# Patient Record
Sex: Male | Born: 1965 | Race: White | Hispanic: No | Marital: Single | Smoking: Former smoker
Health system: Southern US, Community
[De-identification: ages and names within clinical notes are randomized; demographics above are authoritative.]

## PROBLEM LIST (undated history)

## (undated) DIAGNOSIS — F319 Bipolar disorder, unspecified: Secondary | ICD-10-CM

## (undated) DIAGNOSIS — K649 Unspecified hemorrhoids: Secondary | ICD-10-CM

## (undated) DIAGNOSIS — K219 Gastro-esophageal reflux disease without esophagitis: Secondary | ICD-10-CM

## (undated) DIAGNOSIS — F514 Sleep terrors [night terrors]: Secondary | ICD-10-CM

## (undated) DIAGNOSIS — I10 Essential (primary) hypertension: Secondary | ICD-10-CM

## (undated) HISTORY — PX: KNEE SURGERY: SHX244

---

## 2012-05-15 ENCOUNTER — Emergency Department (HOSPITAL_COMMUNITY): Payer: Non-veteran care

## 2012-05-15 ENCOUNTER — Inpatient Hospital Stay (HOSPITAL_COMMUNITY)
Admission: EM | Admit: 2012-05-15 | Discharge: 2012-05-17 | DRG: 313 | Disposition: A | Discharge status: Home / Self Care | Payer: Non-veteran care | Attending: Internal Medicine | Admitting: Internal Medicine

## 2012-05-15 DIAGNOSIS — F319 Bipolar disorder, unspecified: Secondary | ICD-10-CM | POA: Diagnosis present

## 2012-05-15 DIAGNOSIS — Z833 Family history of diabetes mellitus: Secondary | ICD-10-CM

## 2012-05-15 DIAGNOSIS — E669 Obesity, unspecified: Secondary | ICD-10-CM | POA: Diagnosis present

## 2012-05-15 DIAGNOSIS — R0789 Other chest pain: Principal | ICD-10-CM | POA: Diagnosis present

## 2012-05-15 DIAGNOSIS — Z8249 Family history of ischemic heart disease and other diseases of the circulatory system: Secondary | ICD-10-CM

## 2012-05-15 DIAGNOSIS — K219 Gastro-esophageal reflux disease without esophagitis: Secondary | ICD-10-CM

## 2012-05-15 DIAGNOSIS — I951 Orthostatic hypotension: Secondary | ICD-10-CM

## 2012-05-15 DIAGNOSIS — I10 Essential (primary) hypertension: Secondary | ICD-10-CM

## 2012-05-15 DIAGNOSIS — R079 Chest pain, unspecified: Secondary | ICD-10-CM

## 2012-05-15 DIAGNOSIS — N179 Acute kidney failure, unspecified: Secondary | ICD-10-CM

## 2012-05-15 DIAGNOSIS — Z6841 Body Mass Index (BMI) 40.0 and over, adult: Secondary | ICD-10-CM

## 2012-05-15 HISTORY — DX: Essential (primary) hypertension: I10

## 2012-05-15 HISTORY — DX: Bipolar disorder, unspecified: F31.9

## 2012-05-15 HISTORY — DX: Sleep terrors (night terrors): F51.4

## 2012-05-15 HISTORY — DX: Gastro-esophageal reflux disease without esophagitis: K21.9

## 2012-05-15 HISTORY — DX: Unspecified hemorrhoids: K64.9

## 2012-05-15 LAB — POCT I-STAT, CHEM 8
Chloride: 109 mEq/L (ref 96–112)
Glucose, Bld: 111 mg/dL — ABNORMAL HIGH (ref 70–99)
HCT: 37 % — ABNORMAL LOW (ref 39.0–52.0)
Hemoglobin: 12.6 g/dL — ABNORMAL LOW (ref 13.0–17.0)
Potassium: 3.9 mEq/L (ref 3.5–5.1)
Sodium: 139 mEq/L (ref 135–145)

## 2012-05-15 LAB — CBC WITH DIFFERENTIAL/PLATELET
Basophils Relative: 0 % (ref 0–1)
Eosinophils Absolute: 0.6 10*3/uL (ref 0.0–0.7)
Hemoglobin: 13.1 g/dL (ref 13.0–17.0)
Lymphs Abs: 2.5 10*3/uL (ref 0.7–4.0)
MCH: 30.1 pg (ref 26.0–34.0)
MCHC: 36.3 g/dL — ABNORMAL HIGH (ref 30.0–36.0)
Monocytes Relative: 5 % (ref 3–12)
Neutro Abs: 6.2 10*3/uL (ref 1.7–7.7)
Neutrophils Relative %: 64 % (ref 43–77)
Platelets: 289 10*3/uL (ref 150–400)
RBC: 4.35 MIL/uL (ref 4.22–5.81)

## 2012-05-15 LAB — POCT I-STAT TROPONIN I: Troponin i, poc: 0 ng/mL (ref 0.00–0.08)

## 2012-05-15 MED ORDER — SODIUM CHLORIDE 0.9 % IV BOLUS (SEPSIS)
1000.0000 mL | Freq: Once | INTRAVENOUS | Status: AC
Start: 2012-05-15 — End: 2012-05-16
  Administered 2012-05-16: 1000 mL via INTRAVENOUS

## 2012-05-15 NOTE — ED Notes (Signed)
Patient was working at [employer], wears heavy coat and started feeling hot, dizzy, weak, sob and chest pain.  EMS was called.  They gave patient 324of aspiring and started IV transported to ED.

## 2012-05-15 NOTE — ED Provider Notes (Signed)
History     CSN: [id number]  Arrival date & time 05/15/12  2319   First MD Initiated Contact with Patient 05/15/12 2319      No chief complaint on file.   (Consider location/radiation/quality/duration/timing/severity/associated sxs/prior treatment) Patient is a 46 y.o. male presenting with chest pain. The history is provided by the patient.  Chest Pain The chest pain began less than 1 hour ago. Duration of episode(s) is 2 minutes. Chest pain occurs constantly. The chest pain is resolved. Associated with: feeling overheated. The severity of the pain is severe. The quality of the pain is described as sharp. The pain radiates to the left shoulder. Pertinent negatives for primary symptoms include no palpitations and no nausea.  Associated symptoms include diaphoresis. He tried nothing for the symptoms. Risk factors include male gender and obesity.  Pertinent negatives for past medical history include no MI.  Pertinent negatives for family medical history include: no early MI in family.  Procedure history is negative for cardiac catheterization and stress echo.     No past medical history on file.  No past surgical history on file.  No family history on file.  History  Substance Use Topics  . Smoking status: Not on file  . Smokeless tobacco: Not on file  . Alcohol Use: Not on file      Review of Systems  Constitutional: Positive for diaphoresis.  Cardiovascular: Positive for chest pain. Negative for palpitations.  Gastrointestinal: Negative for nausea.  All other systems reviewed and are negative.    Allergies  Review of patient's allergies indicates not on file.  Home Medications  No current outpatient prescriptions on file.  SpO2 98%  Physical Exam  Constitutional: He is oriented to person, place, and time. He appears well-developed and well-nourished. No distress.  HENT:  Head: Normocephalic and atraumatic.  Mouth/Throat: Oropharynx is clear and moist.  Eyes:  Conjunctivae normal are normal. Pupils are equal, round, and reactive to light.  Neck: Normal range of motion. Neck supple.  Cardiovascular: Normal rate.   Pulmonary/Chest: Effort normal and breath sounds normal. He has no wheezes. He has no rales.  Abdominal: Soft. Bowel sounds are normal. There is no tenderness. There is no rebound and no guarding.  Musculoskeletal: Normal range of motion.  Neurological: He is alert and oriented to person, place, and time.  Skin: Skin is warm and dry.  Psychiatric: He has a normal mood and affect.    ED Course  Procedures (including critical care time)   Labs Reviewed  CBC WITH DIFFERENTIAL   No results found.   No diagnosis found.    MDM   Date: 05/15/2012  Rate: 77  Rhythm: normal sinus rhythm  QRS Axis: normal  Intervals: normal  ST/T Wave abnormalities: normal  Conduction Disutrbances: none  Narrative Interpretation: unremarkable           Sydna Brodowski K Alayssa Flinchum-Rasch, MD 05/16/12 [phone]

## 2012-05-16 ENCOUNTER — Encounter (HOSPITAL_COMMUNITY): Payer: Self-pay | Admitting: Emergency Medicine

## 2012-05-16 DIAGNOSIS — I10 Essential (primary) hypertension: Secondary | ICD-10-CM | POA: Diagnosis present

## 2012-05-16 DIAGNOSIS — I951 Orthostatic hypotension: Secondary | ICD-10-CM

## 2012-05-16 DIAGNOSIS — K219 Gastro-esophageal reflux disease without esophagitis: Secondary | ICD-10-CM | POA: Diagnosis present

## 2012-05-16 DIAGNOSIS — R079 Chest pain, unspecified: Secondary | ICD-10-CM | POA: Diagnosis present

## 2012-05-16 DIAGNOSIS — N179 Acute kidney failure, unspecified: Secondary | ICD-10-CM | POA: Diagnosis present

## 2012-05-16 LAB — CBC
Platelets: 273 10*3/uL (ref 150–400)
RDW: 12.2 % (ref 11.5–15.5)
WBC: 9.5 10*3/uL (ref 4.0–10.5)

## 2012-05-16 LAB — BASIC METABOLIC PANEL
Calcium: 9.2 mg/dL (ref 8.4–10.5)
Chloride: 106 mEq/L (ref 96–112)
Creatinine, Ser: 1.55 mg/dL — ABNORMAL HIGH (ref 0.50–1.35)
GFR calc Af Amer: 60 mL/min — ABNORMAL LOW (ref >90)
Sodium: 139 mEq/L (ref 135–145)

## 2012-05-16 LAB — TROPONIN I
Troponin I: <0.3 ng/mL (ref <0.30)
Troponin I: <0.3 ng/mL (ref <0.30)

## 2012-05-16 MED ORDER — ONDANSETRON HCL 4 MG PO TABS: 4.0000 mg | ORAL_TABLET | Freq: Four times a day (QID) | ORAL | Status: DC | PRN

## 2012-05-16 MED ORDER — HYDROCHLOROTHIAZIDE 25 MG PO TABS
25.0000 mg | ORAL_TABLET | Freq: Every day | ORAL | Status: DC
  Filled 2012-05-16: qty 1

## 2012-05-16 MED ORDER — ZOLPIDEM TARTRATE 5 MG PO TABS: 5.0000 mg | ORAL_TABLET | Freq: Every evening | ORAL | Status: DC | PRN

## 2012-05-16 MED ORDER — ACETAMINOPHEN 650 MG RE SUPP: 650.0000 mg | Freq: Four times a day (QID) | RECTAL | Status: DC | PRN

## 2012-05-16 MED ORDER — VITAMIN C 500 MG PO TABS
500.0000 mg | ORAL_TABLET | Freq: Every day | ORAL | Status: AC
  Administered 2012-05-16: 500 mg via ORAL
  Filled 2012-05-16: qty 1

## 2012-05-16 MED ORDER — ONDANSETRON HCL 4 MG/2ML IJ SOLN: 4.0000 mg | Freq: Four times a day (QID) | INTRAMUSCULAR | Status: DC | PRN

## 2012-05-16 MED ORDER — ACETAMINOPHEN 325 MG PO TABS: 650.0000 mg | ORAL_TABLET | Freq: Four times a day (QID) | ORAL | Status: DC | PRN

## 2012-05-16 MED ORDER — ADULT MULTIVITAMIN W/MINERALS CH
1.0000 | ORAL_TABLET | Freq: Every day | ORAL | Status: DC
  Administered 2012-05-16 – 2012-05-17 (×2): 1 via ORAL
  Filled 2012-05-16 (×2): qty 1

## 2012-05-16 MED ORDER — ALUM & MAG HYDROXIDE-SIMETH 200-200-20 MG/5ML PO SUSP: 30.0000 mL | Freq: Four times a day (QID) | ORAL | Status: DC | PRN

## 2012-05-16 MED ORDER — SODIUM CHLORIDE 0.9 % IJ SOLN
3.0000 mL | Freq: Two times a day (BID) | INTRAMUSCULAR | Status: DC
  Administered 2012-05-16: 3 mL via INTRAVENOUS

## 2012-05-16 MED ORDER — OXYCODONE HCL 5 MG PO TABS: 5.0000 mg | ORAL_TABLET | ORAL | Status: DC | PRN

## 2012-05-16 MED ORDER — INFLUENZA VIRUS VACC SPLIT PF IM SUSP
0.5000 mL | INTRAMUSCULAR | Status: AC
  Administered 2012-05-17: 0.5 mL via INTRAMUSCULAR
  Filled 2012-05-16: qty 0.5

## 2012-05-16 MED ORDER — CLOZAPINE 100 MG PO TABS
200.0000 mg | ORAL_TABLET | Freq: Every day | ORAL | Status: DC
  Administered 2012-05-16: 200 mg via ORAL
  Filled 2012-05-16 (×2): qty 2

## 2012-05-16 MED ORDER — HYDROMORPHONE HCL PF 1 MG/ML IJ SOLN: 0.5000 mg | INTRAMUSCULAR | Status: DC | PRN

## 2012-05-16 MED ORDER — PNEUMOCOCCAL VAC POLYVALENT 25 MCG/0.5ML IJ INJ
0.5000 mL | INJECTION | INTRAMUSCULAR | Status: AC
  Administered 2012-05-17: 0.5 mL via INTRAMUSCULAR
  Filled 2012-05-16: qty 0.5

## 2012-05-16 MED ORDER — SODIUM CHLORIDE 0.9 % IV SOLN
INTRAVENOUS | Status: DC
  Administered 2012-05-16 – 2012-05-17 (×3): via INTRAVENOUS

## 2012-05-16 MED ORDER — ENOXAPARIN SODIUM 40 MG/0.4ML ~~LOC~~ SOLN
40.0000 mg | Freq: Every day | SUBCUTANEOUS | Status: DC
  Administered 2012-05-16 – 2012-05-17 (×2): 40 mg via SUBCUTANEOUS
  Filled 2012-05-16 (×2): qty 0.4

## 2012-05-16 MED ORDER — ASPIRIN EC 325 MG PO TBEC
325.0000 mg | DELAYED_RELEASE_TABLET | Freq: Every day | ORAL | Status: DC
  Administered 2012-05-16 – 2012-05-17 (×2): 325 mg via ORAL
  Filled 2012-05-16 (×2): qty 1

## 2012-05-16 MED ORDER — ENOXAPARIN SODIUM 150 MG/ML ~~LOC~~ SOLN: 1.0000 mg/kg | Freq: Two times a day (BID) | SUBCUTANEOUS | Status: DC

## 2012-05-16 NOTE — H&P (Signed)
[facility] History and Physical  Derrick Freeman ZOX:[id number] DOB: 08/02/1966 DOA: 05/15/2012  Referring physician: EDP   PCP: No primary provider on file.  Specialists:   Chief Complaint: Chest Pain  HPI: Derrick Freeman is a 46 y.o. male who presents with complaints of chest pain which started at 9:30pm while at work this evening at [employer].  He described the pain as sharp like stabbing needles that lasted about 2 minutes.  The pain was rated as a /10.  He had associated symptoms of dizziness, and lightheadedness, along with diaphoresis.  He denied having any nausea or vomiting.     Review of Systems: The patient denies anorexia, fever, weight loss, vision loss, decreased hearing, hoarseness, syncope, dyspnea on exertion, peripheral edema, balance deficits, hemoptysis, abdominal pain, melena, hematochezia, severe indigestion/heartburn, hematuria, incontinence, genital sores, muscle weakness, suspicious skin lesions, transient blindness, difficulty walking, depression, unusual weight change, abnormal bleeding, enlarged lymph nodes, angioedema, and breast masses.    Past Medical History  Diagnosis Date  . Bipolar affective disorder   . Hypertension   . Night terrors, adult   . GERD (gastroesophageal reflux disease)   . Hemorrhoids    Past Surgical History:        Arthroscopic Surgery X 4 on Right Knee      Medications:  HOME MEDS: Prior to Admission medications   Medication Sig Start Date End Date Taking? Authorizing Provider  Ascorbic Acid (VITAMIN C PO) Take 1 tablet by mouth daily.   Yes Historical Provider, MD  clozapine (CLOZARIL) 200 MG tablet Take 200 mg by mouth at bedtime.   Yes Historical Provider, MD  HYDROCHLOROTHIAZIDE PO Take 1 tablet by mouth daily.   Yes Historical Provider, MD  lithium carbonate (LITHOBID) 300 MG CR tablet Take 300 mg by mouth 2 (two) times daily.   Yes Historical Provider, MD  Multiple Vitamin (MULTIVITAMIN WITH MINERALS)  TABS Take 1 tablet by mouth daily.   Yes Historical Provider, MD      No Known Allergies   Social History:  reports that he has been smoking Cigarettes.  He has smoked for the past 15 years. He has never used smokeless tobacco. He reports that he does not drink alcohol or use illicit drugs.  Family History:       CAD in Mother     HTN in Both Parents      DM in Mother        Physical Exam:  GEN:  Pleasant 46 year old Obese Caucasian Male examined  and in no acute distress; cooperative with exam Filed Vitals:   05/15/12 2324 05/15/12 2340 05/16/12 0243  BP:  114/69 131/79  Pulse:  78 72  Temp:  97.9 F (36.6 C)   TempSrc:  Oral   Resp:   16  SpO2: 98% 100% 100%   Blood pressure 131/79, pulse 72, temperature 97.9 F (36.6 C), temperature source Oral, resp. rate 16, SpO2 100.00%. PSYCH: He is alert and oriented x4; does not appear anxious does not appear depressed; affect is normal HEENT: Normocephalic and Atraumatic, Mucous membranes pink; PERRLA; EOM intact; Fundi:  Benign;  No scleral icterus, Nares: Patent, Oropharynx: Clear, Fair Dentition, Neck:  FROM, no cervical lymphadenopathy nor thyromegaly or carotid bruit; no JVD; Breasts:: Not examined CHEST WALL: No tenderness CHEST: Normal respiration, clear to auscultation bilaterally HEART: Regular rate and rhythm; no murmurs rubs or gallops BACK: No kyphosis or scoliosis; no CVA tenderness ABDOMEN: Positive Bowel Sounds, Obese, soft  non-tender; no masses, no organomegaly. Rectal Exam: Not done EXTREMITIES: No bone or joint deformity; age-appropriate arthropathy of the hands and knees; no cyanosis, clubbing or edema; no ulcerations. Genitalia: not examined PULSES: 2+ and symmetric SKIN: Normal hydration no rash or ulceration CNS: Cranial nerves 2-12 grossly intact no focal neurologic deficit   Labs on Admission:  Basic Metabolic Panel:  Lab 05/15/12 5621  NA 139  K 3.9  CL 109  CO2 --  GLUCOSE 111*  BUN 22    CREATININE 1.70*  CALCIUM --  MG --  PHOS --   Liver Function Tests: No results found for this basename: AST:5,ALT:5,ALKPHOS:5,BILITOT:5,PROT:5,ALBUMIN:5 in the last 168 hours No results found for this basename: LIPASE:5,AMYLASE:5 in the last 168 hours No results found for this basename: AMMONIA:5 in the last 168 hours CBC:  Lab 05/15/12 2338 05/15/12 2326  WBC -- 9.7  NEUTROABS -- 6.2  HGB 12.6* 13.1  HCT 37.0* 36.1*  MCV -- 83.0  PLT -- 289   Cardiac Enzymes: No results found for this basename: CKTOTAL:5,CKMB:5,CKMBINDEX:5,TROPONINI:5 in the last 168 hours  BNP (last 3 results) No results found for this basename: PROBNP:3 in the last 8760 hours CBG: No results found for this basename: GLUCAP:5 in the last 168 hours  Radiological Exams on Admission: Dg Chest 2 View  05/16/2012  *RADIOLOGY REPORT*  Clinical Data: Chest pain and tightness.  Dizziness and weakness. Smoker.  CHEST - 2 VIEW  Comparison: None.  Findings: Poor inspiration.  Grossly normal sized heart.  Right diaphragmatic eventration.  Clear lungs.  Mild diffuse peribronchial thickening.  IMPRESSION: Mild bronchitic changes.   Original Report Authenticated By: Darrol Angel, M.D.     EKG: Independently reviewed.   Assessment: Principal Problem:  *Chest pain Active Problems:  Hypertension  GERD (gastroesophageal reflux disease)    Plan:   Admit to 23 Hour OBS for cardiac ruleout Telemetry Bed Cardiac Enzymes Reconcile Home Medications DVT Prophylaxis    Code Status:  (FULL CODE) Family Communication:   N/A   Disposition Plan:  Return to Home  Time spent: 28 Minutes  Ron Parker [facility] Pager [phone]  If 7PM-7AM, please contact night-coverage [email or web address] Password [email or web address] 05/16/2012, 3:06 AM

## 2012-05-16 NOTE — Progress Notes (Signed)
[facility] PROGRESS NOTE Interim History:    Assessment/Plan: Patient Active Hospital Problem List: Orthostatic hypotension: -He relates his dizziness has resolved this morning.he relates that his dizziness was upon standing. He relates no further nausea or vomiting. He relates it was really hot when this started he is on a diuretic at home. There is most likely the cause. He relates no nausea vomiting or diarrhea.   Acute kidney injury: -He is on HCTZ at home. Hold HCTZ while in the hospital. His renal function is improving with normal saline.Marland Kitchen His blood pressure is improving.  -Continue normal saline.  -Check a lithium level. A lithium level within normal restart his lithium.  Chest pain (05/16/2012) -cardiac enzymes continue to be negative EKG shows a normal sinus rhythm with T wave inversions. No events on telemetry. -He is not doing any heavy exercises when this chest pain happened. It only lasted about 5 seconds. Question if this is probably some tachycardia cardia secondary to orthostatic hypotension. -Obese male with hypertension. Will need a stress test as an outpatient.   Hypertension (05/16/2012) -DC HCTZ  GERD (gastroesophageal reflux disease) (05/16/2012) -continue PPI   Code Status: (FULL CODE)  Family Communication: N/A  Disposition Plan: Return to Home    Consultants:  None  Procedures:  None  Antibiotics:  None (indicate start date, and stop date if known)  HPI/Subjective: Patient relates his orthostasis has resolved. Had no further episodes of chest pain.  Objective: Filed Vitals:   05/16/12 0245 05/16/12 0300 05/16/12 0315 05/16/12 0339  BP: 120/73 116/69 113/70 131/83  Pulse: 77 70 67 77  Temp:    98.6 F (37 C)  TempSrc:    Oral  Resp: 15 15 12 16   Height:    5\' 10"  (1.778 m)  Weight:    135.7 kg (299 lb 2.6 oz)  SpO2: 100% 100% 100% 98%   No intake or output data in the 24 hours ending 05/16/12 0818 Filed Weights   05/16/12  0339  Weight: 135.7 kg (299 lb 2.6 oz)    Exam:  General: Alert, awake, oriented x3, in no acute distress.  HEENT: No bruits, no goiter.  Heart: Regular rate and rhythm, without murmurs, rubs, gallops.  Lungs: Good air movement, clear to auscultation Abdomen: Soft, nontender, nondistended, positive bowel sounds.  Neuro: Grossly intact, nonfocal.   Data Reviewed: Basic Metabolic Panel:  Lab 05/16/12 1610 05/15/12 2338  NA 139 139  K 4.1 3.9  CL 106 109  CO2 24 --  GLUCOSE 132* 111*  BUN 21 22  CREATININE 1.55* 1.70*  CALCIUM 9.2 --  MG -- --  PHOS -- --   Liver Function Tests: No results found for this basename: AST:5,ALT:5,ALKPHOS:5,BILITOT:5,PROT:5,ALBUMIN:5 in the last 168 hours No results found for this basename: LIPASE:5,AMYLASE:5 in the last 168 hours No results found for this basename: AMMONIA:5 in the last 168 hours CBC:  Lab 05/16/12 0309 05/15/12 2338 05/15/12 2326  WBC 9.5 -- 9.7  NEUTROABS -- -- 6.2  HGB 12.7* 12.6* 13.1  HCT 36.1* 37.0* 36.1*  MCV 83.4 -- 83.0  PLT 273 -- 289   Cardiac Enzymes:  Lab 05/16/12 0309  CKTOTAL --  CKMB --  CKMBINDEX --  TROPONINI <0.30   BNP (last 3 results) No results found for this basename: PROBNP:3 in the last 8760 hours CBG: No results found for this basename: GLUCAP:5 in the last 168 hours  No results found for this or any previous visit (from the past 240 hour(s)).  Studies: Dg Chest 2 View  05/16/2012  *RADIOLOGY REPORT*  Clinical Data: Chest pain and tightness.  Dizziness and weakness. Smoker.  CHEST - 2 VIEW  Comparison: None.  Findings: Poor inspiration.  Grossly normal sized heart.  Right diaphragmatic eventration.  Clear lungs.  Mild diffuse peribronchial thickening.  IMPRESSION: Mild bronchitic changes.   Original Report Authenticated By: Darrol Angel, M.D.     Scheduled Meds:   . aspirin EC  325 mg Oral Daily  . clozapine  200 mg Oral QHS  . enoxaparin (LOVENOX) injection  40 mg Subcutaneous  Daily  . hydrochlorothiazide  25 mg Oral Daily  . influenza  inactive virus vaccine  0.5 mL Intramuscular Tomorrow-1000  . multivitamin with minerals  1 tablet Oral Daily  . pneumococcal 23 valent vaccine  0.5 mL Intramuscular Tomorrow-1000  . sodium chloride  1,000 mL Intravenous Once  . sodium chloride  3 mL Intravenous Q12H  . vitamin C  500 mg Oral Daily  . DISCONTD: enoxaparin (LOVENOX) injection  1 mg/kg Subcutaneous Q12H   Continuous Infusions:   . sodium chloride 75 mL/hr at 05/16/12 0352     Marinda Elk  [facility] Pager [phone]. If 8PM-8AM, please contact night-coverage at [email or web address], password [facility] 05/16/2012, 8:18 AM  LOS: 1 day

## 2012-05-16 NOTE — ED Notes (Signed)
Dr. Jenkins currently at bedside. 

## 2012-05-17 MED ORDER — LITHIUM CARBONATE ER 300 MG PO TBCR
300.0000 mg | EXTENDED_RELEASE_TABLET | Freq: Two times a day (BID) | ORAL | Status: DC
  Administered 2012-05-17: 300 mg via ORAL
  Filled 2012-05-17 (×2): qty 1

## 2012-05-17 MED ORDER — HYDROCHLOROTHIAZIDE 25 MG PO TABS: 25.0000 mg | ORAL_TABLET | Freq: Every day | ORAL | Status: AC

## 2012-05-17 NOTE — Discharge Summary (Signed)
Physician Discharge Summary  Derrick Freeman:[id number] DOB: [birth year] DOA: 05/15/2012  PCP: No primary provider on file.  Admit date: 05/15/2012 Discharge date: 05/17/2012  Recommendations for Outpatient Follow-up:  1. b-met in 2 weeks. 2. Check BP and titrate BP meds as tolerate it.  Discharge Diagnoses:  Principal Problem:  *Chest pain Active Problems:  Hypertension  GERD (gastroesophageal reflux disease)  Orthostatic hypotension  Acute kidney injury   Discharge Condition: stable  Diet recommendation: heart healthy diet  Filed Weights   05/16/12 0339  Weight: 135.7 kg (299 lb 2.6 oz)    History of present illness:  46 y.o. male who presents with complaints of chest pain which started at 9:30pm while at work this evening at [employer]. He described the pain as sharp like stabbing needles that lasted about 2 minutes. The pain was rated as a /10. He had associated symptoms of dizziness, and lightheadedness, along with diaphoresis. He denied having any nausea or vomiting.    Hospital Course:  Principal Problem: Chest pain: He was admitted to the hospital cardiac enzymes were cycled x3 which were negative he was monitoring telemetry with no events. He described his chest pain as lasting 3 seconds in the tingling in his chest. We'll need to follow up with his primary care Dr. Fredricka Bonine cardiac in origin.  Hypertension: -No changes were made to his medication. He will resume his hydrochlorothiazide and 4 days after discharge.  GERD (gastroesophageal reflux disease) -Continue PPI.  Orthostatic hypotension -He was describing dizziness upon standing on admission his creatinine was high there is no baseline creatinine. His lithium level was checked which was started on IV fluids and his creatinine came down. His orthostasis resolved. His hydrochlorothiazide was held and he will resume this in 4 days after discharge.  Acute kidney injury: -This most likely secondary to  decreased intravascular volume. He relates no diarrhea or vomiting. The does relate he has not been taking as much water as he used to he was in the room and it was hot and he continues to his hydrochlorothiazide. A basic metabolic panel in one week and will followup with his primary care Dr.  Procedures:  none (i.e. Studies not automatically included, echos, thoracentesis, etc; not x-rays)  Consultations:  none  Discharge Exam: Filed Vitals:   05/16/12 1453 05/16/12 2200 05/17/12 0147 05/17/12 0500  BP: 122/80 128/86 120/66 122/69  Pulse: 74 76 97 96  Temp: 98.4 F (36.9 C) 98.3 F (36.8 C) 98.5 F (36.9 C) 98.3 F (36.8 C)  TempSrc:  Oral Oral Oral  Resp: 18 20 18 16   Height:      Weight:      SpO2: 98% 99% 97% 96%    General: Awake alert and oriented x3 Cardiovascular: Regular rate and rhythm Respiratory: Good air movement clear to auscultation  Discharge Instructions  Discharge Orders    Future Orders Please Complete By Expires   Diet - low sodium heart healthy      Increase activity slowly          Medication List     As of 05/17/2012  7:26 AM    TAKE these medications         clozapine 200 MG tablet   Commonly known as: CLOZARIL   Take 200 mg by mouth at bedtime.      hydrochlorothiazide 25 MG tablet   Commonly known as: HYDRODIURIL   Take 1 tablet (25 mg total) by mouth daily.   Start taking  on: 05/20/2012      lithium carbonate 300 MG CR tablet   Commonly known as: LITHOBID   Take 300 mg by mouth 2 (two) times daily.      multivitamin with minerals Tabs   Take 1 tablet by mouth daily.      VITAMIN C PO   Take 1 tablet by mouth daily.          The results of significant diagnostics from this hospitalization (including imaging, microbiology, ancillary and laboratory) are listed below for reference.    Significant Diagnostic Studies: Dg Chest 2 View  05/16/2012  *RADIOLOGY REPORT*  Clinical Data: Chest pain and tightness.  Dizziness and  weakness. Smoker.  CHEST - 2 VIEW  Comparison: None.  Findings: Poor inspiration.  Grossly normal sized heart.  Right diaphragmatic eventration.  Clear lungs.  Mild diffuse peribronchial thickening.  IMPRESSION: Mild bronchitic changes.   Original Report Authenticated By: Darrol Angel, M.D.     Microbiology: No results found for this or any previous visit (from the past 240 hour(s)).   Labs: Basic Metabolic Panel:  Lab 05/16/12 8119 05/15/12 2338  NA 139 139  K 4.1 3.9  CL 106 109  CO2 24 --  GLUCOSE 132* 111*  BUN 21 22  CREATININE 1.55* 1.70*  CALCIUM 9.2 --  MG -- --  PHOS -- --   Liver Function Tests: No results found for this basename: AST:5,ALT:5,ALKPHOS:5,BILITOT:5,PROT:5,ALBUMIN:5 in the last 168 hours No results found for this basename: LIPASE:5,AMYLASE:5 in the last 168 hours No results found for this basename: AMMONIA:5 in the last 168 hours CBC:  Lab 05/16/12 0309 05/15/12 2338 05/15/12 2326  WBC 9.5 -- 9.7  NEUTROABS -- -- 6.2  HGB 12.7* 12.6* 13.1  HCT 36.1* 37.0* 36.1*  MCV 83.4 -- 83.0  PLT 273 -- 289   Cardiac Enzymes:  Lab 05/16/12 1440 05/16/12 0852 05/16/12 0309  CKTOTAL -- -- --  CKMB -- -- --  CKMBINDEX -- -- --  TROPONINI <0.30 <0.30 <0.30   BNP: BNP (last 3 results) No results found for this basename: PROBNP:3 in the last 8760 hours CBG: No results found for this basename: GLUCAP:5 in the last 168 hours  Time coordinating discharge: *45 minutes  Signed:  Marinda Elk  [facility] 05/17/2012, 7:26 AM

## 2015-10-06 ENCOUNTER — Emergency Department (HOSPITAL_COMMUNITY): Payer: Non-veteran care

## 2015-10-06 ENCOUNTER — Emergency Department (HOSPITAL_COMMUNITY)
Admission: EM | Admit: 2015-10-06 | Discharge: 2015-10-06 | Disposition: A | Discharge status: Home / Self Care | Payer: Non-veteran care | Attending: Emergency Medicine | Admitting: Emergency Medicine

## 2015-10-06 ENCOUNTER — Encounter (HOSPITAL_COMMUNITY): Payer: Self-pay | Admitting: Emergency Medicine

## 2015-10-06 DIAGNOSIS — R079 Chest pain, unspecified: Secondary | ICD-10-CM | POA: Insufficient documentation

## 2015-10-06 DIAGNOSIS — Z79899 Other long term (current) drug therapy: Secondary | ICD-10-CM | POA: Diagnosis not present

## 2015-10-06 DIAGNOSIS — Z87891 Personal history of nicotine dependence: Secondary | ICD-10-CM | POA: Diagnosis not present

## 2015-10-06 DIAGNOSIS — R0602 Shortness of breath: Secondary | ICD-10-CM | POA: Diagnosis not present

## 2015-10-06 DIAGNOSIS — R110 Nausea: Secondary | ICD-10-CM | POA: Insufficient documentation

## 2015-10-06 DIAGNOSIS — K219 Gastro-esophageal reflux disease without esophagitis: Secondary | ICD-10-CM | POA: Diagnosis not present

## 2015-10-06 DIAGNOSIS — R42 Dizziness and giddiness: Secondary | ICD-10-CM | POA: Diagnosis not present

## 2015-10-06 DIAGNOSIS — I10 Essential (primary) hypertension: Secondary | ICD-10-CM | POA: Insufficient documentation

## 2015-10-06 DIAGNOSIS — F319 Bipolar disorder, unspecified: Secondary | ICD-10-CM | POA: Diagnosis not present

## 2015-10-06 LAB — CBC
HEMATOCRIT: 44.1 % (ref 39.0–52.0)
HEMOGLOBIN: 14.6 g/dL (ref 13.0–17.0)
MCH: 27.3 pg (ref 26.0–34.0)
MCHC: 33.1 g/dL (ref 30.0–36.0)
MCV: 82.4 fL (ref 78.0–100.0)
Platelets: 275 10*3/uL (ref 150–400)
RBC: 5.35 MIL/uL (ref 4.22–5.81)
RDW: 13.6 % (ref 11.5–15.5)
WBC: 9.7 10*3/uL (ref 4.0–10.5)

## 2015-10-06 LAB — BASIC METABOLIC PANEL
ANION GAP: 7 (ref 5–15)
BUN: 12 mg/dL (ref 6–20)
CHLORIDE: 107 mmol/L (ref 101–111)
CO2: 21 mmol/L — AB (ref 22–32)
Calcium: 8.7 mg/dL — ABNORMAL LOW (ref 8.9–10.3)
Creatinine, Ser: 1.36 mg/dL — ABNORMAL HIGH (ref 0.61–1.24)
GFR calc non Af Amer: 59 mL/min — ABNORMAL LOW (ref >60)
GLUCOSE: 115 mg/dL — AB (ref 65–99)
Potassium: 4 mmol/L (ref 3.5–5.1)
Sodium: 135 mmol/L (ref 135–145)

## 2015-10-06 LAB — I-STAT TROPONIN, ED: Troponin i, poc: 0 ng/mL (ref 0.00–0.08)

## 2015-10-06 NOTE — ED Notes (Signed)
Patient to ED c/o intermittent L sided CP that originates in mid L arm and shoots to L chest, accompanied by SOB, dizziness, fatigue, and diaphoresis. Patient states this has happened last week, he went to the [place] and was discharged. This CP started Wednesday night and has been more intense than last week. Respirations e/u. A&O x 4.

## 2015-10-06 NOTE — ED Provider Notes (Signed)
CSN: [id number]     Arrival date & time 10/06/15  0615 History   First MD Initiated Contact with Patient 10/06/15 0701     Chief Complaint  Patient presents with  . Chest Pain     (Consider location/radiation/quality/duration/timing/severity/associated sxs/prior Treatment) HPI Comments: Patient is a 50 year old male with history of hypertension and bipolar disorder. He presents for evaluation of chest discomfort. He reports 3 evenings ago experiencing a 10 minute episode of severe crushing chest pain which made him feel nauseated and short of breath, and caused him to break out in a sweat. This eventually resolved on its own. The patient states that since this time he has been feeling weak and fatigued as well as dizzy. He denies any additional episodes of chest pain or shortness of breath. He denies any fevers, chills, or cough.  Patient is a 50 y.o. male presenting with chest pain. The history is provided by the patient.  Chest Pain Pain location:  Substernal area Pain quality: pressure   Pain radiates to:  Does not radiate Pain radiates to the back: no   Pain severity:  Severe Onset quality:  Sudden Duration:  10 minutes Timing:  Constant Progression:  Resolved Chronicity:  New Relieved by:  Nothing Worsened by:  Nothing tried Ineffective treatments:  None tried Associated symptoms: dizziness, nausea and shortness of breath     Past Medical History  Diagnosis Date  . Bipolar affective disorder (HCC)   . Hypertension   . Night terrors, adult   . GERD (gastroesophageal reflux disease)   . Hemorrhoids    Past Surgical History  Procedure Laterality Date  . Knee surgery     Family History  Problem Relation Age of Onset  . Heart failure Father    Social History  Substance Use Topics  . Smoking status: Former Smoker -- 15 years    Types: Cigarettes    Quit date: 10/05/2014  . Smokeless tobacco: Never Used  . Alcohol Use: No    Review of Systems  Respiratory:  Positive for shortness of breath.   Cardiovascular: Positive for chest pain.  Gastrointestinal: Positive for nausea.  Neurological: Positive for dizziness.  All other systems reviewed and are negative.     Allergies  Review of patient's allergies indicates no known allergies.  Home Medications   Prior to Admission medications   Medication Sig Start Date End Date Taking? Authorizing Provider  Cholecalciferol (VITAMIN D PO) Take 1 tablet by mouth daily.   Yes Historical Provider, MD  lisinopril-hydrochlorothiazide (PRINZIDE,ZESTORETIC) 20-12.5 MG tablet Take 1 tablet by mouth daily.   Yes Historical Provider, MD  lithium carbonate (LITHOBID) 300 MG CR tablet Take 300-600 mg by mouth See admin instructions. Take 1 capsule in the morning and take 2 capsules every evening   Yes Historical Provider, MD  omeprazole (PRILOSEC) 20 MG capsule Take 20 mg by mouth daily.   Yes Historical Provider, MD  polycarbophil (FIBERCON) 625 MG tablet Take 625 mg by mouth daily.   Yes Historical Provider, MD  QUEtiapine (SEROQUEL) 300 MG tablet Take 600 mg by mouth at bedtime.   Yes Historical Provider, MD  hydrochlorothiazide (HYDRODIURIL) 25 MG tablet Take 1 tablet (25 mg total) by mouth daily. Patient not taking: Reported on 10/06/2015 05/20/12   Marinda Elk, MD   BP 128/80 mmHg  Pulse 96  Temp(Src) 98.4 F (36.9 C) (Oral)  Resp 16  Ht  (1.803 m)  Wt 320 lb (145.151 kg)  BMI 44.65 kg/m2  SpO2 97% Physical Exam  Constitutional: He is oriented to person, place, and time. He appears well-developed and well-nourished. No distress.  HENT:  Head: Normocephalic and atraumatic.  Neck: Normal range of motion. Neck supple.  Cardiovascular: Normal rate, regular rhythm and normal heart sounds.   No murmur heard. Pulmonary/Chest: Effort normal and breath sounds normal. No respiratory distress. He has no wheezes. He has no rales.  Abdominal: Soft. Bowel sounds are normal. He exhibits no  distension. There is no tenderness.  Musculoskeletal: Normal range of motion. He exhibits no edema.  Neurological: He is alert and oriented to person, place, and time.  Skin: Skin is warm and dry. He is not diaphoretic.  Nursing note and vitals reviewed.   ED Course  Procedures (including critical care time) Labs Review Labs Reviewed  BASIC METABOLIC PANEL - Abnormal; Notable for the following:    CO2 21 (*)    Glucose, Bld 115 (*)    Creatinine, Ser 1.36 (*)    Calcium 8.7 (*)    GFR calc non Af Amer 59 (*)    All other components within normal limits  CBC  I-STAT TROPOININ, ED    Imaging Review Dg Chest 2 View  10/06/2015  CLINICAL DATA:  Acute onset of left-sided chest pain, shortness of breath and dizziness. Left arm and shoulder pain. Initial encounter. EXAM: CHEST  2 VIEW COMPARISON:  Chest radiograph performed 05/15/2012 FINDINGS: The lungs are well-aerated. Mild vascular congestion is noted. There is no evidence of focal opacification, pleural effusion or pneumothorax. The heart is normal in size; the mediastinal contour is within normal limits. No acute osseous abnormalities are seen. IMPRESSION: Mild vascular congestion noted.  Lungs remain grossly clear. Electronically Signed   By: Roanna Raider M.D.   On: 10/06/2015 07:12   I have personally reviewed and evaluated these images and lab results as part of my medical decision-making.   EKG Interpretation   Date/Time:  Saturday October 06 2015 06:18:55 EST Ventricular Rate:  103 PR Interval:  136 QRS Duration: 82 QT Interval:  340 QTC Calculation: 445 R Axis:     Text Interpretation:  Sinus tachycardia Low voltage QRS Cannot rule out  Anterior infarct , age undetermined Abnormal ECG Confirmed by Shavar Gorka  MD,  Latanga Nedrow ([id number]) on 10/06/2015 7:04:09 AM      MDM   Final diagnoses:  None    Patient's workup is unremarkable. His EKG is unchanged and troponin is negative greater than 48 hours after the episode of  chest pain he describes. The patient does have risk factors and I feel follow-up with cardiology as an outpatient to discuss a possible stress test would be appropriate. He will be provided the follow-up information for the [facility] cardiology office and is to return to the emergency department if his symptoms worsen or change in the meantime.    Geoffery Lyons, MD 10/06/15 [phone]

## 2015-10-06 NOTE — Discharge Instructions (Signed)
Follow-up with Cardiology in the next 3 days.  Call on Monday to arrange this appointment.  The contact information for the [facility] Cardiology office has been provided in this discharge summary.  Return to the ER in the meantime if your symptoms significantly worsen or change.   Nonspecific Chest Pain  Chest pain can be caused by many different conditions. There is always a chance that your pain could be related to something serious, such as a heart attack or a blood clot in your lungs. Chest pain can also be caused by conditions that are not life-threatening. If you have chest pain, it is very important to follow up with your health care provider. CAUSES  Chest pain can be caused by:  Heartburn.  Pneumonia or bronchitis.  Anxiety or stress.  Inflammation around your heart (pericarditis) or lung (pleuritis or pleurisy).  A blood clot in your lung.  A collapsed lung (pneumothorax). It can develop suddenly on its own (spontaneous pneumothorax) or from trauma to the chest.  Shingles infection (varicella-zoster virus).  Heart attack.  Damage to the bones, muscles, and cartilage that make up your chest wall. This can include:  Bruised bones due to injury.  Strained muscles or cartilage due to frequent or repeated coughing or overwork.  Fracture to one or more ribs.  Sore cartilage due to inflammation (costochondritis). RISK FACTORS  Risk factors for chest pain may include:  Activities that increase your risk for trauma or injury to your chest.  Respiratory infections or conditions that cause frequent coughing.  Medical conditions or overeating that can cause heartburn.  Heart disease or family history of heart disease.  Conditions or health behaviors that increase your risk of developing a blood clot.  Having had chicken pox (varicella zoster). SIGNS AND SYMPTOMS Chest pain can feel like:  Burning or tingling on the surface of your chest or deep in your  chest.  Crushing, pressure, aching, or squeezing pain.  Dull or sharp pain that is worse when you move, cough, or take a deep breath.  Pain that is also felt in your back, neck, shoulder, or arm, or pain that spreads to any of these areas. Your chest pain may come and go, or it may stay constant. DIAGNOSIS Lab tests or other studies may be needed to find the cause of your pain. Your health care provider may have you take a test called an ambulatory ECG (electrocardiogram). An ECG records your heartbeat patterns at the time the test is performed. You may also have other tests, such as:  Transthoracic echocardiogram (TTE). During echocardiography, sound waves are used to create a picture of all of the heart structures and to look at how blood flows through your heart.  Transesophageal echocardiogram (TEE).This is a more advanced imaging test that obtains images from inside your body. It allows your health care provider to see your heart in finer detail.  Cardiac monitoring. This allows your health care provider to monitor your heart rate and rhythm in real time.  Holter monitor. This is a portable device that records your heartbeat and can help to diagnose abnormal heartbeats. It allows your health care provider to track your heart activity for several days, if needed.  Stress tests. These can be done through exercise or by taking medicine that makes your heart beat more quickly.  Blood tests.  Imaging tests. TREATMENT  Your treatment depends on what is causing your chest pain. Treatment may include:  Medicines. These may include:  Acid blockers  for heartburn.  Anti-inflammatory medicine.  Pain medicine for inflammatory conditions.  Antibiotic medicine, if an infection is present.  Medicines to dissolve blood clots.  Medicines to treat coronary artery disease.  Supportive care for conditions that do not require medicines. This may include:  Resting.  Applying heat or cold  packs to injured areas.  Limiting activities until pain decreases. HOME CARE INSTRUCTIONS  If you were prescribed an antibiotic medicine, finish it all even if you start to feel better.  Avoid any activities that bring on chest pain.  Do not use any tobacco products, including cigarettes, chewing tobacco, or electronic cigarettes. If you need help quitting, ask your health care provider.  Do not drink alcohol.  Take medicines only as directed by your health care provider.  Keep all follow-up visits as directed by your health care provider. This is important. This includes any further testing if your chest pain does not go away.  If heartburn is the cause for your chest pain, you may be told to keep your head raised (elevated) while sleeping. This reduces the chance that acid will go from your stomach into your esophagus.  Make lifestyle changes as directed by your health care provider. These may include:  Getting regular exercise. Ask your health care provider to suggest some activities that are safe for you.  Eating a heart-healthy diet. A registered dietitian can help you to learn healthy eating options.  Maintaining a healthy weight.  Managing diabetes, if necessary.  Reducing stress. SEEK MEDICAL CARE IF:  Your chest pain does not go away after treatment.  You have a rash with blisters on your chest.  You have a fever. SEEK IMMEDIATE MEDICAL CARE IF:   Your chest pain is worse.  You have an increasing cough, or you cough up blood.  You have severe abdominal pain.  You have severe weakness.  You faint.  You have chills.  You have sudden, unexplained chest discomfort.  You have sudden, unexplained discomfort in your arms, back, neck, or jaw.  You have shortness of breath at any time.  You suddenly start to sweat, or your skin gets clammy.  You feel nauseous or you vomit.  You suddenly feel light-headed or dizzy.  Your heart begins to beat quickly, or  it feels like it is skipping beats. These symptoms may represent a serious problem that is an emergency. Do not wait to see if the symptoms will go away. Get medical help right away. Call your local emergency services (911 in the [place] Do not drive yourself to the hospital.   This information is not intended to replace advice given to you by your health care provider. Make sure you discuss any questions you have with your health care provider.   Document Released: 05/07/2005 Document Revised: 08/18/2014 Document Reviewed: 03/03/2014 Elsevier Interactive Patient Education [employer].

## 2017-08-17 IMAGING — CR DG CHEST 2V
2 series · 2 of 2 positions shown · non-contrast
Comparison: Chest radiograph performed 05/15/2012

CLINICAL DATA: Acute onset of left-sided chest pain, shortness of
breath and dizziness. Left arm and shoulder pain. Initial encounter.

EXAM:
CHEST  2 VIEW

[chest pa]
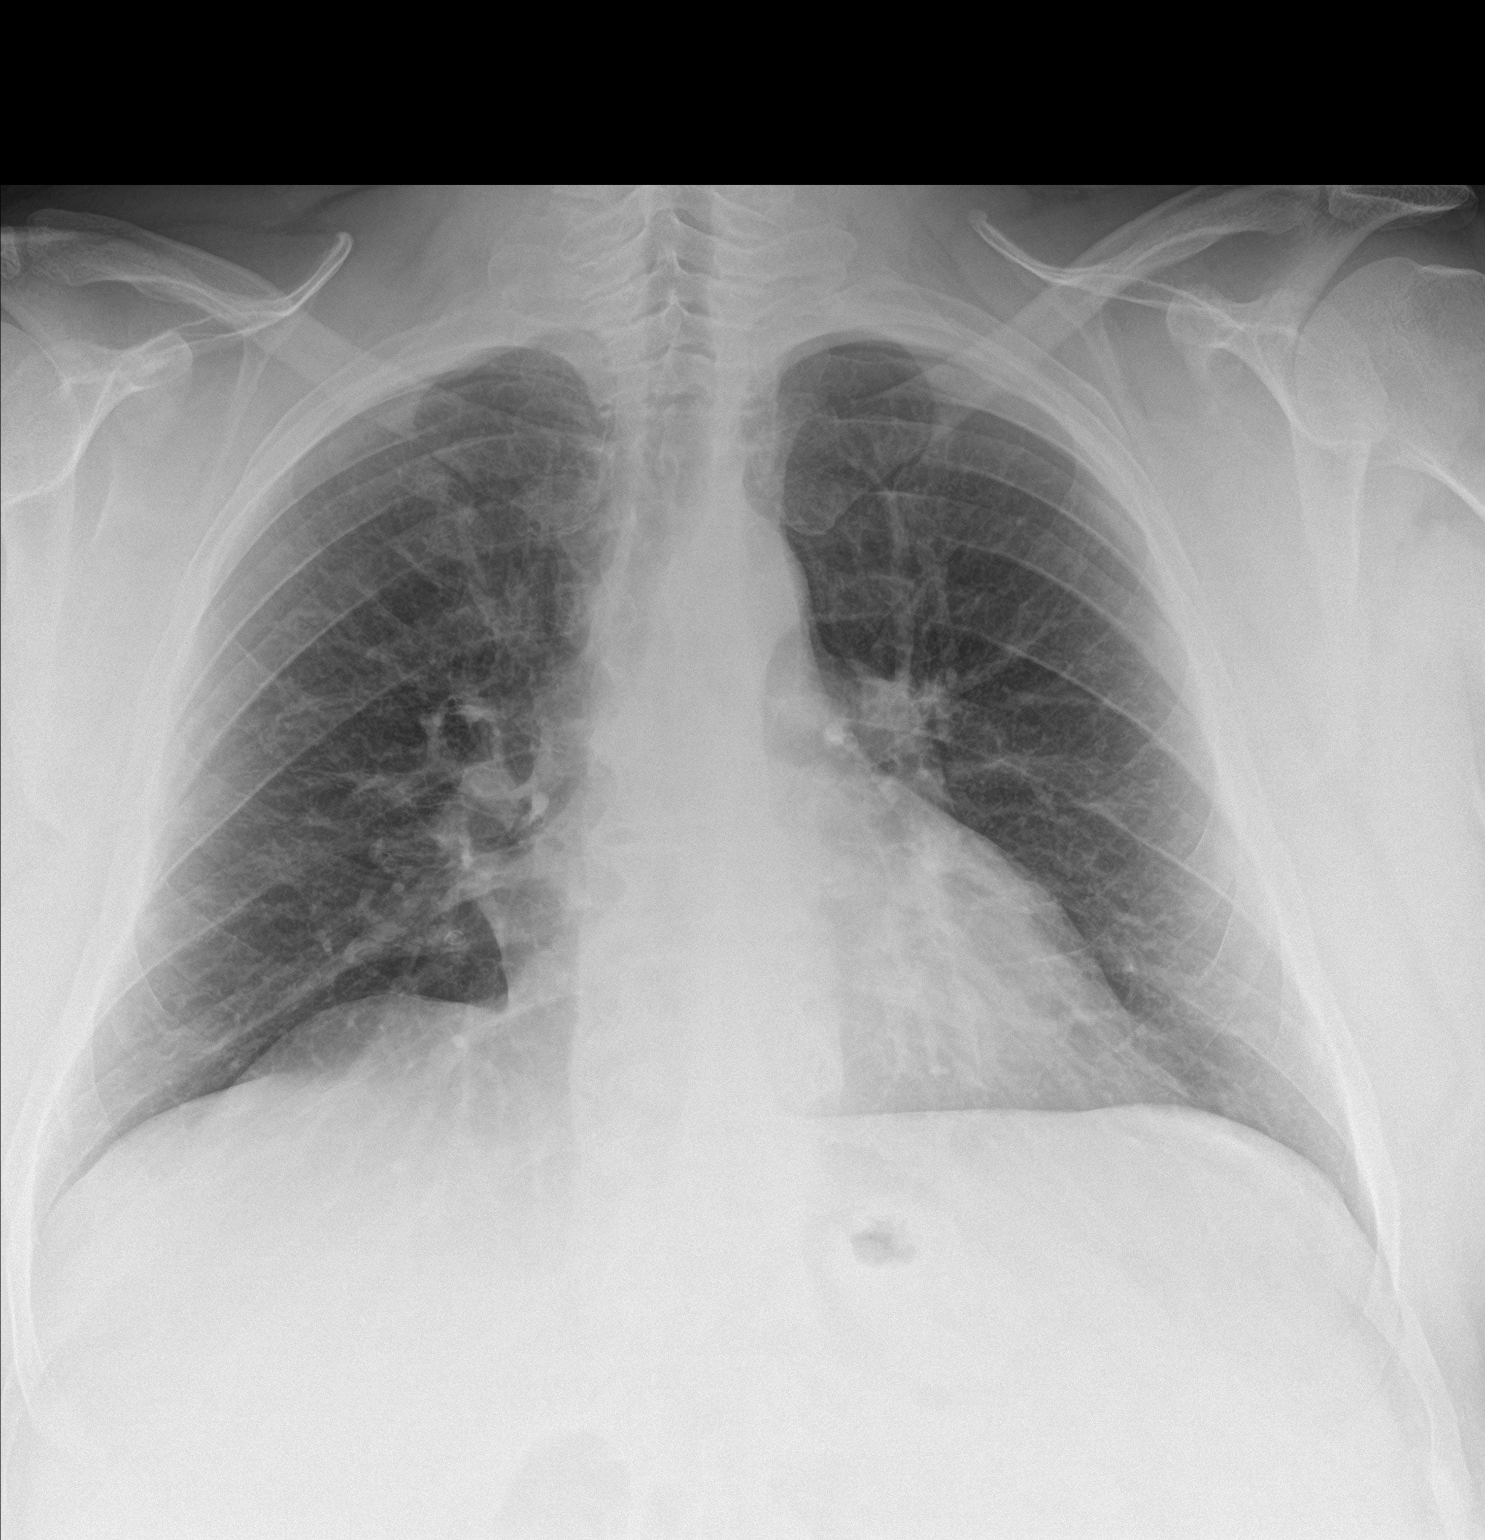

[chest lat]
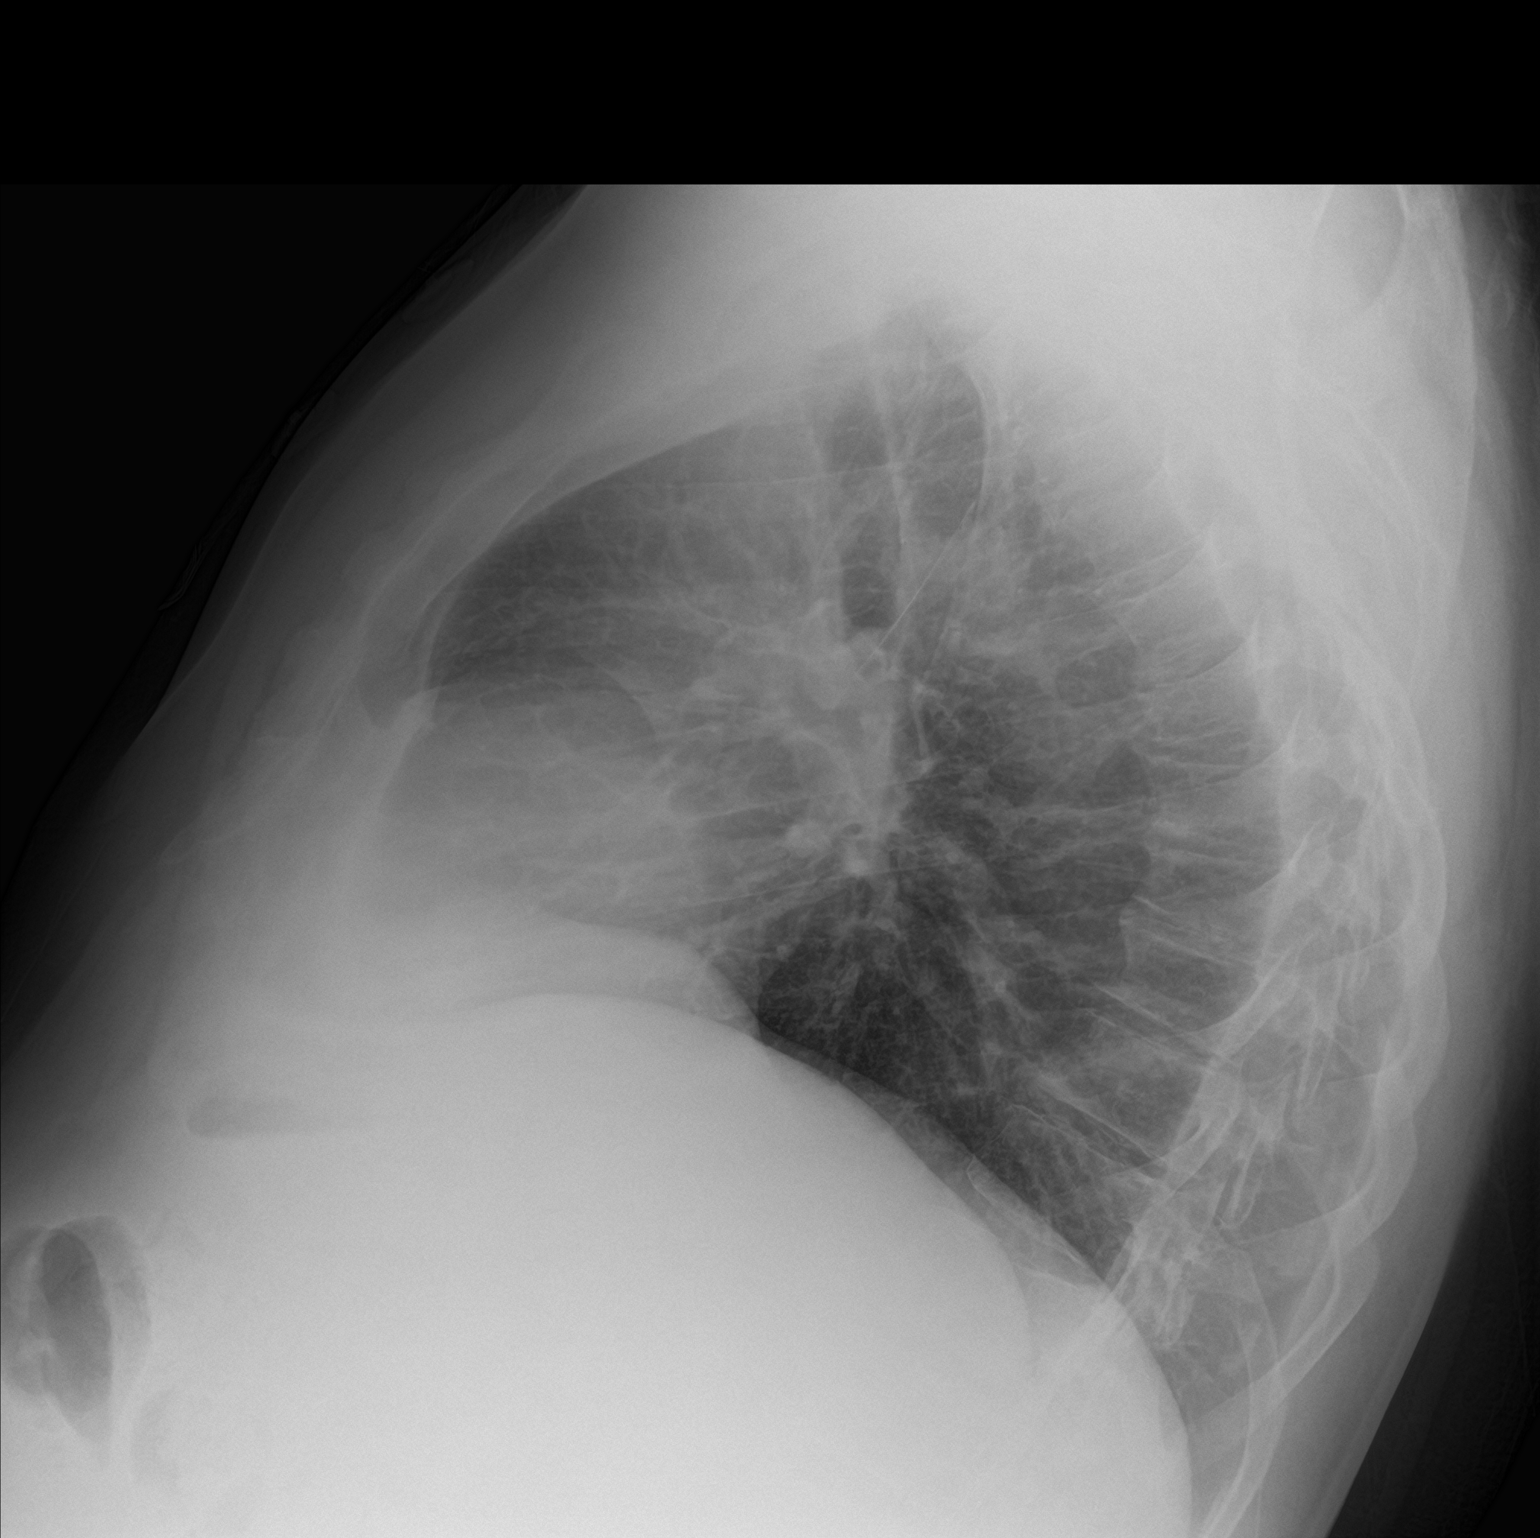

[2 of 2 positions shown; findings below may reference images not displayed]

FINDINGS: The lungs are well-aerated. Mild vascular congestion is noted. There
is no evidence of focal opacification, pleural effusion or
pneumothorax.

The heart is normal in size; the mediastinal contour is within
normal limits. No acute osseous abnormalities are seen.
IMPRESSION: Mild vascular congestion noted.  Lungs remain grossly clear.
